# Patient Record
Sex: Female | Born: 1995
Health system: Southern US, Community
[De-identification: ages and names within clinical notes are randomized; demographics above are authoritative.]

---
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
## 2014-11-09 ENCOUNTER — Emergency Department (HOSPITAL_BASED_OUTPATIENT_CLINIC_OR_DEPARTMENT_OTHER)
Admission: EM | Admit: 2014-11-09 | Discharge: 2014-11-09 | Disposition: A | Discharge status: Home / Self Care | Payer: BLUE CROSS/BLUE SHIELD | Attending: Emergency Medicine | Admitting: Emergency Medicine

## 2014-11-09 ENCOUNTER — Emergency Department (HOSPITAL_BASED_OUTPATIENT_CLINIC_OR_DEPARTMENT_OTHER): Payer: BLUE CROSS/BLUE SHIELD

## 2014-11-09 ENCOUNTER — Encounter (HOSPITAL_BASED_OUTPATIENT_CLINIC_OR_DEPARTMENT_OTHER): Payer: Self-pay

## 2014-11-09 DIAGNOSIS — S29012A Strain of muscle and tendon of back wall of thorax, initial encounter: Secondary | ICD-10-CM | POA: Diagnosis not present

## 2014-11-09 DIAGNOSIS — Y998 Other external cause status: Secondary | ICD-10-CM | POA: Insufficient documentation

## 2014-11-09 DIAGNOSIS — Y92410 Unspecified street and highway as the place of occurrence of the external cause: Secondary | ICD-10-CM | POA: Insufficient documentation

## 2014-11-09 DIAGNOSIS — S0990XA Unspecified injury of head, initial encounter: Secondary | ICD-10-CM | POA: Insufficient documentation

## 2014-11-09 DIAGNOSIS — Y9389 Activity, other specified: Secondary | ICD-10-CM | POA: Diagnosis not present

## 2014-11-09 DIAGNOSIS — S24109A Unspecified injury at unspecified level of thoracic spinal cord, initial encounter: Secondary | ICD-10-CM | POA: Diagnosis present

## 2014-11-09 DIAGNOSIS — S39012A Strain of muscle, fascia and tendon of lower back, initial encounter: Secondary | ICD-10-CM

## 2014-11-09 MED ORDER — IBUPROFEN 600 MG PO TABS: 600.0000 mg | ORAL_TABLET | Freq: Four times a day (QID) | ORAL | Status: DC | PRN

## 2014-11-09 MED ORDER — IBUPROFEN 400 MG PO TABS
400.0000 mg | ORAL_TABLET | Freq: Once | ORAL | Status: AC
  Administered 2014-11-09: 400 mg via ORAL
  Filled 2014-11-09: qty 1

## 2014-11-09 MED ORDER — CYCLOBENZAPRINE HCL 10 MG PO TABS: 10.0000 mg | ORAL_TABLET | Freq: Two times a day (BID) | ORAL | Status: DC | PRN
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
## 2016-12-26 ENCOUNTER — Ambulatory Visit (INDEPENDENT_AMBULATORY_CARE_PROVIDER_SITE_OTHER): Payer: BLUE CROSS/BLUE SHIELD | Admitting: Cardiology

## 2016-12-26 ENCOUNTER — Encounter: Payer: Self-pay | Admitting: Cardiology

## 2016-12-26 VITALS — BP 118/74 | HR 95 | Ht 67.0 in | Wt 126.0 lb

## 2016-12-26 DIAGNOSIS — R002 Palpitations: Secondary | ICD-10-CM

## 2016-12-26 NOTE — Progress Notes (Signed)
Cardiology Office Note    Date:  12/26/2016   ID:  Destiny Valentine, DOB 10-10-1995, MRN [id number]  PCP:  Pcp Not In System  Cardiologist:   Donato Schultz, MD     History of Present Illness:  Destiny Valentine is a 21 y.o. female here for evaluation of PVCs and tachycardia at the request of Julio Sicks, NP.  She is a family history of tachycardia. She has reported having palpitations. Her heart rate was a proximal lead 100 with occasional ectopy during OB/GYN visit. Thyroid tests were drawn and were normal.  Randomly will feel palpitations especially when she is tired or stressed. She does not drink much caffeine. She rarely drinks alcohol. No stimulants or supplements. The way she describes [place] palpitations are her heart skips a beat and then beats really hard she states. Sounds like PVCs. Only once can she remember heart racing. She felt dizzy once. Occasionally she will feel a fleeting chest discomfort, atypical. She has not had any unexplained syncope.  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   Denies any fevers chills orthopnea PND syncope bleeding weakness.   No past medical history on file.  No past surgical history on file.  Current Medications: Outpatient Medications Prior to Visit  Medication Sig Dispense Refill  . cyclobenzaprine (FLEXERIL) 10 MG tablet Take 1 tablet (10 mg total) by mouth 2 (two) times daily as needed for muscle spasms. 20 tablet 0  . ibuprofen (ADVIL,MOTRIN) 600 MG tablet Take 1 tablet (600 mg total) by mouth every 6 (six) hours as needed. 30 tablet 0   No facility-administered medications prior to visit.      Allergies:   Minocycline and Ortho tri-cyclen lo  [norgestimate-eth estradiol]   Social History   Social History  . Marital status: Single    Spouse name: N/A  . Number of children: N/A  . Years of education: N/A   Social History Main Topics  . Smoking status: Never Smoker  . Smokeless tobacco: Never Used  . Alcohol use  None  . Drug use: Unknown  . Sexual activity: Not Asked   Other Topics Concern  . None   Social History Narrative  . None     Family History:    Grandmother has palpitations.   ROS:   Please see the history of present illness.    ROS All other systems reviewed and are negative.   PHYSICAL EXAM:   VS:  BP 118/74 (BP Location: Left Arm)   Pulse 95   Ht  (1.702 m)   Wt 126 lb (57.2 kg)   BMI 19.73 kg/m    GEN: Thin, in no acute distress  HEENT: normal  Neck: no JVD, carotid bruits, or masses Cardiac: RRR; no murmurs, rubs, or gallops,no edema  Respiratory:  clear to auscultation bilaterally, normal work of breathing GI: soft, nontender, nondistended, + BS MS: no deformity or atrophy  Skin: warm and dry, no rash Neuro:  Alert and Oriented x 3, Strength and sensation are intact Psych: euthymic mood, full affect  Wt Readings from Last 3 Encounters:  12/26/16 126 lb (57.2 kg)  11/09/14 125 lb (56.7 kg)      Studies/Labs Reviewed:   EKG:  EKG is ordered today.  The ekg ordered today demonstrates Sinus rhythm 78 with nonspecific T-wave changes, mild inversion noted in V2 and V3.  Recent Labs: No results found for requested labs within last 8760 hours.   Lipid Panel No results found for:  CHOL, TRIG, HDL, CHOLHDL, VLDL, LDLCALC, LDLDIRECT  Additional studies/ records that were reviewed today include:   Thyroid studies are normal. Prior office notes reviewed, EKG reviewed    ASSESSMENT:    1. Palpitations      PLAN:  In order of problems listed above:  Palpitations  - From her description, sounds like PVCs or PACs which are benign. During her OB/GYN visit at likely PVCs. Today, no ectopy, EKG unremarkable. No murmurs heard on exam. She conservatively. Try to avoid Sudafed, excessive caffeine. She does not describe any high risk symptoms such as unexplained syncope.  - If symptoms worsen or become are worrisome, we can always place monitor at that time.  Very rarely do we need to prescribe beta blockers for this.  She will let us know.     Medication Adjustments/Labs and Tests Ordered: Current medicines are reviewed at length with the patient today.  Concerns regarding medicines are outlined above.  Medication changes, Labs and Tests ordered today are listed in the Patient Instructions below. Patient Instructions  Medication Instructions:  The current medical regimen is effective;  continue present plan and medications.  Follow-Up: Follow up as needed with Dr Anne Fu.  Thank you for choosing [facility]!!        Signed, Donato Schultz, MD  12/26/2016 3:18 PM    [facility] [address] [place], [place], [place]  [address] Phone: [phone]; Fax: [phone]

## 2016-12-26 NOTE — Patient Instructions (Signed)
Medication Instructions:  The current medical regimen is effective;  continue present plan and medications.  Follow-Up: Follow up as needed with Dr Skains.  Thank you for choosing [facility]!!     

## 2018-04-12 NOTE — Progress Notes (Signed)
Cardiology Office Note:    Date:  04/13/2018   ID:  Destiny Valentine, DOB 05-Mar-1996, MRN [id number]  PCP:  System, Pcp Not In  Cardiologist:  No primary care provider on file.   Referring MD: No ref. provider found     History of Present Illness:    Destiny Valentine is a 22 y.o. female is here for the evaluation of palpitations at the request of Dr. Susette Racer.  Was seen in the [place] walk-in clinic feeling of weakness dizziness lightheadedness heart skipping beating really fast with may be some chest pain on the left side with dull aches.  She had a chest pain episode the morning of her urgent care visit but felt better after relaxing, sitting.  She does take oral contraceptives.  She has been to cardiology in the past about 2 years ago.  She works at [employer].  Her EKG at urgent care was normal without any PVCs.  She did not feel any palpitations at that time.  I personally reviewed ECG.  Normal intervals. Irreg beats felt all day at one point. No syncope. Sometines CP. Here with grandmother. Been escalation for 4 years. Some sweet tea.   No family history of CAD.  History reviewed. No pertinent past medical history.  History reviewed. No pertinent surgical history.  Current Medications: Current Meds  Medication Sig  . JUNEL FE 1/20 1-20 MG-MCG tablet Take 1 tablet by mouth daily.     Allergies:   Minocycline and Ortho tri-cyclen lo  [norgestimate-eth estradiol]   Social History   Socioeconomic History  . Marital status: Single    Spouse name: Not on file  . Number of children: Not on file  . Years of education: Not on file  . Highest education level: Not on file  Occupational History  . Not on file  Social Needs  . Financial resource strain: Not on file  . Food insecurity:    Worry: Not on file    Inability: Not on file  . Transportation needs:    Medical: Not on file    Non-medical: Not on file  Tobacco Use  . Smoking status: Never Smoker  . Smokeless  tobacco: Never Used  Substance and Sexual Activity  . Alcohol use: Not Currently    Frequency: Never  . Drug use: Never  . Sexual activity: Not on file  Lifestyle  . Physical activity:    Days per week: Not on file    Minutes per session: Not on file  . Stress: Not on file  Relationships  . Social connections:    Talks on phone: Not on file    Gets together: Not on file    Attends religious service: Not on file    Active member of club or organization: Not on file    Attends meetings of clubs or organizations: Not on file    Relationship status: Not on file  Other Topics Concern  . Not on file  Social History Narrative  . Not on file     Family History: The patient's family history includes Glaucoma in her paternal grandmother; Hypertension in her paternal grandmother; Other in her paternal grandmother.  ROS:   Please see the history of present illness.     All other systems reviewed and are negative.  EKGs/Labs/Other Studies Reviewed:    The following studies were reviewed today: EKG, walking note, prior office notes reviewed  EKG:  EKG is not ordered today.  Prior EKG reassuring  Recent Labs: No results found for requested labs within last 8760 hours.  Recent Lipid Panel No results found for: CHOL, TRIG, HDL, CHOLHDL, VLDL, LDLCALC, LDLDIRECT  Physical Exam:    VS:  BP 118/80   Pulse 87   Ht 5\' 7"  (1.702 m)   Wt 122 lb (55.3 kg)   SpO2 99%   BMI 19.11 kg/m     Wt Readings from Last 3 Encounters:  04/13/18 122 lb (55.3 kg)  12/26/16 126 lb (57.2 kg)  11/09/14 125 lb (56.7 kg)     GEN: Thin well nourished, well developed in no acute distress HEENT: Normal NECK: No JVD; No carotid bruits LYMPHATICS: No lymphadenopathy CARDIAC: RRR, no murmurs, rubs, gallops RESPIRATORY:  Clear to auscultation without rales, wheezing or rhonchi  ABDOMEN: Soft, non-tender, non-distended MUSCULOSKELETAL:  No edema; No deformity  SKIN: Warm and dry NEUROLOGIC:  Alert and  oriented x 3 PSYCHIATRIC:  Normal affect   ASSESSMENT:    1. Palpitations    PLAN:    In order of problems listed above:  Palpitations - Checking 30-day event monitor, echocardiogram, TSH, basic metabolic profile, CBC.  Replenish electrolytes if palpitations begin, consider Gatorade.  At this point, no medication.  Could consider low-dose beta-blocker as needed in the future.  Likely PVCs or PACs.  Had lengthy description for them.  No high risk symptoms such as syncope.  Atypical chest pain -Fleeting, associated with flip-flop of her heart, palpitation.  Reassurance.    Medication Adjustments/Labs and Tests Ordered: Current medicines are reviewed at length with the patient today.  Concerns regarding medicines are outlined above.  Orders Placed This Encounter  Procedures  . CBC  . Basic metabolic panel  . TSH  . CARDIAC EVENT MONITOR  . ECHOCARDIOGRAM COMPLETE   No orders of the defined types were placed in this encounter.   Patient Instructions  Medication Instructions:  The current medical regimen is effective;  continue present plan and medications.  Labwork: Please have blood work today (CBC, BMP and TSH)  Testing/Procedures: Your physician has requested that you have an echocardiogram. Echocardiography is a painless test that uses sound waves to create images of your heart. It provides your doctor with information about the size and shape of your heart and how well your heart's chambers and valves are working. This procedure takes approximately one hour. There are no restrictions for this procedure.  Your physician has recommended that you wear an event monitor. Event monitors are medical devices that record the heart's electrical activity. Doctors most often us these monitors to diagnose arrhythmias. Arrhythmias are problems with the speed or rhythm of the heartbeat. The monitor is a small, portable device. You can wear one while you do your normal daily activities.  This is usually used to diagnose what is causing palpitations/syncope (passing out).  Follow-Up: Follow up will be determined by the results of the above testing.  Thank you for choosing [facility]!!        Signed, Donato SchultzMark Samanthia Howland, MD  04/13/2018 9:14 AM    [facility]

## 2018-04-13 ENCOUNTER — Ambulatory Visit: Payer: BLUE CROSS/BLUE SHIELD | Admitting: Cardiology

## 2018-04-13 ENCOUNTER — Encounter: Payer: Self-pay | Admitting: Cardiology

## 2018-04-13 VITALS — BP 118/80 | HR 87 | Ht 67.0 in | Wt 122.0 lb

## 2018-04-13 DIAGNOSIS — R002 Palpitations: Secondary | ICD-10-CM | POA: Diagnosis not present

## 2018-04-13 NOTE — Patient Instructions (Signed)
Medication Instructions:  The current medical regimen is effective;  continue present plan and medications.  Labwork: Please have blood work today (CBC, BMP and TSH)  Testing/Procedures: Your physician has requested that you have an echocardiogram. Echocardiography is a painless test that uses sound waves to create images of your heart. It provides your doctor with information about the size and shape of your heart and how well your heart's chambers and valves are working. This procedure takes approximately one hour. There are no restrictions for this procedure.  Your physician has recommended that you wear an event monitor. Event monitors are medical devices that record the heart's electrical activity. Doctors most often us these monitors to diagnose arrhythmias. Arrhythmias are problems with the speed or rhythm of the heartbeat. The monitor is a small, portable device. You can wear one while you do your normal daily activities. This is usually used to diagnose what is causing palpitations/syncope (passing out).  Follow-Up: Follow up will be determined by the results of the above testing.  Thank you for choosing [facility]!!

## 2018-04-14 LAB — BASIC METABOLIC PANEL
BUN/Creatinine Ratio: 12 (ref 9–23)
BUN: 7 mg/dL (ref 6–20)
CALCIUM: 9.9 mg/dL (ref 8.7–10.2)
CHLORIDE: 102 mmol/L (ref 96–106)
CO2: 19 mmol/L — AB (ref 20–29)
Creatinine, Ser: 0.6 mg/dL (ref 0.57–1.00)
GFR calc Af Amer: 148 mL/min/{1.73_m2} (ref >59)
GFR calc non Af Amer: 128 mL/min/{1.73_m2} (ref >59)
Glucose: 82 mg/dL (ref 65–99)
POTASSIUM: 4.3 mmol/L (ref 3.5–5.2)
Sodium: 137 mmol/L (ref 134–144)

## 2018-04-14 LAB — TSH: TSH: 1.99 u[IU]/mL (ref 0.450–4.500)

## 2018-04-14 LAB — CBC
Hematocrit: 43.9 % (ref 34.0–46.6)
Hemoglobin: 13.8 g/dL (ref 11.1–15.9)
MCH: 25.8 pg — AB (ref 26.6–33.0)
MCHC: 31.4 g/dL — AB (ref 31.5–35.7)
MCV: 82 fL (ref 79–97)
PLATELETS: 325 10*3/uL (ref 150–450)
RBC: 5.34 x10E6/uL — ABNORMAL HIGH (ref 3.77–5.28)
RDW: 13.1 % (ref 12.3–15.4)
WBC: 5.5 10*3/uL (ref 3.4–10.8)

## 2018-04-16 ENCOUNTER — Telehealth: Payer: Self-pay | Admitting: Cardiology

## 2018-04-16 NOTE — Telephone Encounter (Signed)
Called patient back and verbalized lab results; patient verbalized understanding.

## 2018-04-16 NOTE — Telephone Encounter (Signed)
New Message   Patient is returning call in reference to lab results. Please call to discuss.  

## 2018-04-23 ENCOUNTER — Ambulatory Visit (HOSPITAL_COMMUNITY): Payer: BLUE CROSS/BLUE SHIELD | Attending: Cardiology

## 2018-04-23 ENCOUNTER — Ambulatory Visit (INDEPENDENT_AMBULATORY_CARE_PROVIDER_SITE_OTHER): Payer: BLUE CROSS/BLUE SHIELD

## 2018-04-23 ENCOUNTER — Other Ambulatory Visit: Payer: Self-pay

## 2018-04-23 DIAGNOSIS — R002 Palpitations: Secondary | ICD-10-CM | POA: Diagnosis present

## 2021-07-23 ENCOUNTER — Other Ambulatory Visit: Payer: Self-pay

## 2021-07-23 ENCOUNTER — Emergency Department (HOSPITAL_COMMUNITY)
Admission: EM | Admit: 2021-07-23 | Discharge: 2021-07-23 | Disposition: A | Discharge status: Home / Self Care | Payer: BLUE CROSS/BLUE SHIELD | Attending: Emergency Medicine | Admitting: Emergency Medicine

## 2021-07-23 DIAGNOSIS — W268XXA Contact with other sharp object(s), not elsewhere classified, initial encounter: Secondary | ICD-10-CM | POA: Diagnosis not present

## 2021-07-23 DIAGNOSIS — Z23 Encounter for immunization: Secondary | ICD-10-CM | POA: Diagnosis not present

## 2021-07-23 DIAGNOSIS — S61512A Laceration without foreign body of left wrist, initial encounter: Secondary | ICD-10-CM | POA: Insufficient documentation

## 2021-07-23 DIAGNOSIS — Z79899 Other long term (current) drug therapy: Secondary | ICD-10-CM | POA: Insufficient documentation

## 2021-07-23 DIAGNOSIS — S6992XA Unspecified injury of left wrist, hand and finger(s), initial encounter: Secondary | ICD-10-CM | POA: Diagnosis present

## 2021-07-23 LAB — CBC WITH DIFFERENTIAL/PLATELET
Abs Immature Granulocytes: 0.01 10*3/uL (ref 0.00–0.07)
Basophils Absolute: 0.1 10*3/uL (ref 0.0–0.1)
Basophils Relative: 1 %
Eosinophils Absolute: 0.2 10*3/uL (ref 0.0–0.5)
Eosinophils Relative: 4 %
HCT: 46.8 % — ABNORMAL HIGH (ref 36.0–46.0)
Hemoglobin: 15.2 g/dL — ABNORMAL HIGH (ref 12.0–15.0)
Immature Granulocytes: 0 %
Lymphocytes Relative: 25 %
Lymphs Abs: 1.4 10*3/uL (ref 0.7–4.0)
MCH: 26.3 pg (ref 26.0–34.0)
MCHC: 32.5 g/dL (ref 30.0–36.0)
MCV: 80.8 fL (ref 80.0–100.0)
Monocytes Absolute: 0.5 10*3/uL (ref 0.1–1.0)
Monocytes Relative: 9 %
Neutro Abs: 3.4 10*3/uL (ref 1.7–7.7)
Neutrophils Relative %: 61 %
Platelets: 310 10*3/uL (ref 150–400)
RBC: 5.79 MIL/uL — ABNORMAL HIGH (ref 3.87–5.11)
RDW: 16.5 % — ABNORMAL HIGH (ref 11.5–15.5)
WBC: 5.6 10*3/uL (ref 4.0–10.5)
nRBC: 0 % (ref 0.0–0.2)

## 2021-07-23 LAB — COMPREHENSIVE METABOLIC PANEL
ALT: 9 U/L (ref 0–44)
AST: 16 U/L (ref 15–41)
Albumin: 4.5 g/dL (ref 3.5–5.0)
Alkaline Phosphatase: 46 U/L (ref 38–126)
Anion gap: 12 (ref 5–15)
BUN: <5 mg/dL — ABNORMAL LOW (ref 6–20)
CO2: 20 mmol/L — ABNORMAL LOW (ref 22–32)
Calcium: 9.5 mg/dL (ref 8.9–10.3)
Chloride: 107 mmol/L (ref 98–111)
Creatinine, Ser: 0.59 mg/dL (ref 0.44–1.00)
GFR, Estimated: >60 mL/min (ref >60)
Glucose, Bld: 82 mg/dL (ref 70–99)
Potassium: 3.6 mmol/L (ref 3.5–5.1)
Sodium: 139 mmol/L (ref 135–145)
Total Bilirubin: 1.3 mg/dL — ABNORMAL HIGH (ref 0.3–1.2)
Total Protein: 7.7 g/dL (ref 6.5–8.1)

## 2021-07-23 LAB — RAPID URINE DRUG SCREEN, HOSP PERFORMED
Amphetamines: NOT DETECTED
Barbiturates: NOT DETECTED
Benzodiazepines: NOT DETECTED
Cocaine: NOT DETECTED
Opiates: NOT DETECTED
Tetrahydrocannabinol: POSITIVE — AB

## 2021-07-23 LAB — I-STAT BETA HCG BLOOD, ED (MC, WL, AP ONLY): I-stat hCG, quantitative: <5 m[IU]/mL (ref <5)

## 2021-07-23 LAB — ETHANOL: Alcohol, Ethyl (B): 61 mg/dL — ABNORMAL HIGH (ref <10)

## 2021-07-23 MED ORDER — LIDOCAINE-EPINEPHRINE (PF) 2 %-1:200000 IJ SOLN
20.0000 mL | Freq: Once | INTRAMUSCULAR | Status: AC
  Administered 2021-07-23: 20 mL
  Filled 2021-07-23: qty 20

## 2021-07-23 MED ORDER — TETANUS-DIPHTH-ACELL PERTUSSIS 5-2.5-18.5 LF-MCG/0.5 IM SUSY
0.5000 mL | PREFILLED_SYRINGE | Freq: Once | INTRAMUSCULAR | Status: AC
  Administered 2021-07-23: 0.5 mL via INTRAMUSCULAR
  Filled 2021-07-23: qty 0.5

## 2021-07-23 NOTE — ED Triage Notes (Signed)
Patient stated she accidentally cut herself too  deep on left arm. States she usually cuts herself d/t stress and she does have behavioral help. Denies SI Destiny Valentine,

## 2021-07-23 NOTE — Discharge Instructions (Addendum)
Please return to the emergency department if you have worsening depression or feelings of hurting yourself or others.  I believe you are stable enough to follow-up with your therapist next week.  Please continue taking your medications as prescribed.  Do your best to keep your laceration covered for the next 24 hours.  After that you may let the area breathe as long as you are not doing any labor or in any places that it can get dirty.  The stitches need to come out in 7 to 10 days.  It would also be good if you got the area checked for infection in the next 2 to 3 days.  Information about lacerations are attached to these discharge papers.  It was a pleasure to meet you and I hope that you feel better.

## 2021-07-23 NOTE — ED Provider Notes (Signed)
Emergency Medicine Provider Triage Evaluation Note  Destiny Valentine , a 25 y.o. female  was evaluated in triage.  Pt complains of laceration to the left wrist.  Patient states that she was cutting her wrist with a razor.  If she accidentally cut herself too deeply to ED visit.  She is unsure regarding tetanus status.  She does report that she has a lot going on currently and feels overwhelmed however denies specifically wanting to end her life.  She denies any HI or AVH.  She reports that she is currently on Remeron, Vistaril, Lamictal and that she sees a Veterinary surgeon and psychiatrist.  States that she used to cut herself however stopped for several years.  Review of Systems  Positive: + laceration to L wrist, depression Negative: - SI, HI, or AVH  Physical Exam  BP (!) 152/99 (BP Location: Right Arm)   Pulse (!) 133   Resp (!) 22   SpO2 97%  Gen:   Awake, no distress   Resp:  Normal effort  MSK:   Moves extremities without difficulty  Other:  3 cm laceration to ventral aspect of L wrist; bleeding controlled at this time. Additional superficial 3 cm laceration distal to previous lac. 2+ radial pulse.   Medical Decision Making  Medically screening exam initiated at 1:57 PM.  Appropriate orders placed.  Tisa L Heitmeyer was informed that the remainder of the evaluation will be completed by another provider, this initial triage assessment does not replace that evaluation, and the importance of remaining in the ED until their evaluation is complete.     Tanda Rockers, PA-C 07/23/21 1359    Arby Barrette, MD 07/24/21 [phone]

## 2021-07-23 NOTE — ED Provider Notes (Signed)
[facility] EMERGENCY DEPARTMENT Provider Note   CSN: [id number] Arrival date & time: 07/23/21  1351     History Chief Complaint  Patient presents with   Extremity Laceration    Destiny Valentine is a 25 y.o. female with a past medical history of anxiety and depression presenting today with a laceration to her left wrist.  She reports that she used to self-harm when she was younger however stopped doing so.  This morning she was having a more difficult time than usual and felt as though cutting herself might make her feel better.  Denies any SI/HI.  No AVH.  As soon as she noticed that she cut herself deeper than previously she called her brother and they presented to the emergency department.  She takes mirtazapine, hydroxyzine and lamotrigine for her depression and anxiety.  Sees a therapist and psychiatrist monthly.  She reports she is currently working a job that she does not like it is difficult due to her social anxiety.  Denies extensive alcohol use and occasionally smokes marijuana.  Had 1 beer this already.    No past medical history on file.  Patient Active Problem List   Diagnosis Date Noted   Palpitations 12/26/2016    No past surgical history on file.   OB History   No obstetric history on file.     Family History  Problem Relation Age of Onset   Hypertension Paternal Grandmother    Glaucoma Paternal Grandmother    Other Paternal Grandmother        palpitations    Social History   Tobacco Use   Smoking status: Never   Smokeless tobacco: Never  Vaping Use   Vaping Use: Never used  Substance Use Topics   Alcohol use: Not Currently   Drug use: Never    Home Medications Prior to Admission medications   Medication Sig Start Date End Date Taking? Authorizing Provider  JUNEL FE 1/20 1-20 MG-MCG tablet Take 1 tablet by mouth daily. 11/13/16   [provider]    Allergies    Minocycline and Ortho tri-cyclen lo  [norgestimate-eth  estradiol]  Review of Systems   Review of Systems  Constitutional:  Negative for activity change.  Respiratory:  Negative for shortness of breath.   Cardiovascular:  Negative for chest pain and palpitations.  Skin:  Positive for wound.  Psychiatric/Behavioral:  Negative for hallucinations and suicidal ideas. The patient is nervous/anxious.   All other systems reviewed and are negative.  Physical Exam Updated Vital Signs BP (!) 152/99 (BP Location: Right Arm)   Pulse (!) 133   Resp (!) 22   SpO2 97%   Physical Exam Vitals and nursing note reviewed.  Constitutional:      General: She is not in acute distress.    Appearance: Normal appearance.  HENT:     Head: Normocephalic and atraumatic.  Eyes:     General: No scleral icterus.    Conjunctiva/sclera: Conjunctivae normal.  Pulmonary:     Effort: Pulmonary effort is normal. No respiratory distress.  Musculoskeletal:        General: Normal range of motion.     Comments: Full range of motion at the wrist and elbow.  Strong pulses.  Skin:    General: Skin is warm and dry.     Findings: Lesion present. No rash.     Comments: 3 cm horizontal laceration noted to anterior left wrist.  Bleeding controlled.  No foreign bodies  Neurological:  Mental Status: She is alert.  Psychiatric:        Behavior: Behavior normal.     Comments: Depressed mood    ED Results / Procedures / Treatments   Labs (all labs ordered are listed, but only abnormal results are displayed) Labs Reviewed  COMPREHENSIVE METABOLIC PANEL  ETHANOL  RAPID URINE DRUG SCREEN, HOSP PERFORMED  CBC WITH DIFFERENTIAL/PLATELET  I-STAT BETA HCG BLOOD, ED ([facility], [facility], [facility] ONLY)    EKG None  Radiology No results found.  Procedures .Marland KitchenLaceration Repair  Date/Time: 07/23/2021 3:02 PM Performed by: Saddie Benders, PA-C Authorized by: Saddie Benders, PA-C   Consent:    Consent obtained:  Verbal   Consent given by:  Patient   Risks, benefits, and  alternatives were discussed: yes     Risks discussed:  Infection, pain and poor cosmetic result Universal protocol:    Patient identity confirmed:  Verbally with patient Anesthesia:    Anesthesia method:  Local infiltration   Local anesthetic:  Lidocaine 2% WITH epi Laceration details:    Location:  Shoulder/arm   Length (cm):  3 Pre-procedure details:    Preparation:  Patient was prepped and draped in usual sterile fashion Exploration:    Limited defect created (wound extended): no     Hemostasis achieved with:  Cautery   Imaging outcome: foreign body not noted     Wound exploration: entire depth of wound visualized   Treatment:    Area cleansed with:  Povidone-iodine and saline   Amount of cleaning:  Standard   Irrigation solution:  Sterile saline   Irrigation volume:  40cc   Irrigation method:  Syringe   Visualized foreign bodies/material removed: no     Debridement:  None   Undermining:  None Skin repair:    Repair method:  Sutures   Suture size:  5-0   Suture material:  Prolene   Number of sutures:  7 Approximation:    Approximation:  Close Repair type:    Repair type:  Simple Post-procedure details:    Dressing:  Sterile dressing   Procedure completion:  Tolerated well, no immediate complications   Medications Ordered in ED Medications  Tdap (BOOSTRIX) injection 0.5 mL (0.5 mLs Intramuscular Given 07/23/21 1422)  lidocaine-EPINEPHrine (XYLOCAINE W/EPI) 2 %-1:200000 (PF) injection 20 mL (20 mLs Infiltration Given 07/23/21 1423)    ED Course  I have reviewed the triage vital signs and the nursing notes.  Pertinent labs & imaging results that were available during my care of the patient were reviewed by me and considered in my medical decision making (see chart for details).    MDM Rules/Calculators/A&P Patient was evaluated by me.  She was in no acute distress.  She reported a depressed mood however her affect and behavior are normal.  She tolerated the suture  repair very well.  7 sutures were placed that need to be removed and 7 to 10 days.  She understands this plan.  It was also suggested that she follow-up somewhere in 2 days to assess for any infection.  We discussed signs of infection and she voiced understanding.  She was sutured in the hallway however the procedure was as sterile as possible.  At this time she is medically cleared.   She reports that she has therapy next week as well as a appointment with her psychiatrist coming up.  She denies any suicidal ideations and has not harmed herself in a long time.  Feels as though she tried to get  out some stress like she used to and had immediate regrets.  She presented for help the moment she realized that she was bleeding.  I do not believe she is at a high risk for suicide.  I spoke with her about her comfort level with being discharged versus staying in the emergency department to speak with our counseling team.  She reported that she did feel like she was going to be okay if she went home.  She has therapy early next week and continues to deny any SI/HI or AVH.  At this moment I am going to put the patient up for discharge with strict return precautions.  Final Clinical Impression(s) / ED Diagnoses Final diagnoses:  Laceration of left wrist, initial encounter    Rx / DC Orders Results and diagnoses were explained to the patient. Return precautions discussed in full. Patient had no additional questions and expressed complete understanding.     Saddie Benders, PA-C 07/23/21 1647    Arby Barrette, MD 07/24/21 [phone]
